# Patient Record
Sex: Female | Born: 2000 | Race: White | Hispanic: No | Marital: Single | Smoking: Never smoker
Health system: Southern US, Community
[De-identification: ages and names within clinical notes are randomized; demographics above are authoritative.]

## PROBLEM LIST (undated history)

## (undated) DIAGNOSIS — U071 COVID-19: Secondary | ICD-10-CM

## (undated) DIAGNOSIS — N809 Endometriosis, unspecified: Secondary | ICD-10-CM

## (undated) DIAGNOSIS — G43909 Migraine, unspecified, not intractable, without status migrainosus: Secondary | ICD-10-CM

## (undated) DIAGNOSIS — J45909 Unspecified asthma, uncomplicated: Secondary | ICD-10-CM

## (undated) DIAGNOSIS — N83209 Unspecified ovarian cyst, unspecified side: Secondary | ICD-10-CM

## (undated) DIAGNOSIS — L709 Acne, unspecified: Secondary | ICD-10-CM

## (undated) HISTORY — PX: TONSILLECTOMY: SUR1361

## (undated) HISTORY — PX: TONSILLECTOMY AND ADENOIDECTOMY: SHX28

## (undated) HISTORY — DX: Unspecified ovarian cyst, unspecified side: N83.209

## (undated) HISTORY — DX: Acne, unspecified: L70.9

---
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
## 2021-10-20 ENCOUNTER — Ambulatory Visit (INDEPENDENT_AMBULATORY_CARE_PROVIDER_SITE_OTHER): Payer: Medicaid Other | Admitting: Obstetrics and Gynecology

## 2021-10-20 VITALS — BP 116/71 | HR 97 | Temp 98.0°F | Wt 193.9 lb

## 2021-10-20 DIAGNOSIS — R3915 Urgency of urination: Secondary | ICD-10-CM | POA: Diagnosis not present

## 2021-10-20 LAB — POCT URINALYSIS DIPSTICK OB
Bilirubin, UA: NEGATIVE
Glucose, UA: NEGATIVE
Ketones, UA: NEGATIVE
Leukocytes, UA: NEGATIVE
Nitrite, UA: NEGATIVE
POC,PROTEIN,UA: NEGATIVE
Spec Grav, UA: >=1.03 — AB (ref 1.010–1.025)
Urobilinogen, UA: 0.2 E.U./dL
pH, UA: 6.5 (ref 5.0–8.0)

## 2021-10-20 NOTE — Progress Notes (Unsigned)
Patient states she is feeling like she is unable to completely void "feels heavy"for couple days now, per protocol unable to send medication today, sent urine culture to lab.

## 2021-10-22 LAB — URINE CULTURE

## 2021-10-22 NOTE — Progress Notes (Signed)
Taylor: Please contact her and see if she is still feeling the heaviness symptoms.  This urine is not really diagnostic for UTI but with her symptoms I may go ahead and treat her anyway.  If she is asymptomatic-nothing to do.  If she still has a lot of symptoms please prescribe Macrobid for her.

## 2021-10-23 ENCOUNTER — Other Ambulatory Visit: Payer: Self-pay

## 2021-10-23 DIAGNOSIS — R399 Unspecified symptoms and signs involving the genitourinary system: Secondary | ICD-10-CM

## 2021-10-23 MED ORDER — NITROFURANTOIN MONOHYD MACRO 100 MG PO CAPS: 100.0000 mg | ORAL_CAPSULE | Freq: Two times a day (BID) | ORAL | 0 refills | Status: AC

## 2021-10-31 ENCOUNTER — Encounter: Payer: Self-pay | Admitting: Obstetrics and Gynecology

## 2021-10-31 ENCOUNTER — Ambulatory Visit (INDEPENDENT_AMBULATORY_CARE_PROVIDER_SITE_OTHER): Payer: Medicaid Other | Admitting: Obstetrics and Gynecology

## 2021-10-31 VITALS — BP 130/75 | HR 102 | Ht 68.0 in | Wt 195.0 lb

## 2021-10-31 DIAGNOSIS — Z30431 Encounter for routine checking of intrauterine contraceptive device: Secondary | ICD-10-CM

## 2021-10-31 NOTE — Progress Notes (Signed)
Patient presents today for IUD string check. She states no problems with current IUD. No questions or concerns at this time.
